# Patient Record
Sex: Female | Born: 1990 | Race: Black or African American | Hispanic: No | Marital: Single | Smoking: Current every day smoker
Health system: Southern US, Community
[De-identification: ages and names within clinical notes are randomized; demographics above are authoritative.]

---
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
## 2009-09-11 ENCOUNTER — Emergency Department (HOSPITAL_COMMUNITY): Admission: EM | Admit: 2009-09-11 | Discharge: 2009-09-11 | Payer: Self-pay | Admitting: Family Medicine

## 2011-04-03 LAB — POCT PREGNANCY, URINE: Preg Test, Ur: NEGATIVE

## 2011-10-02 LAB — POCT RAPID STREP A: Streptococcus, Group A Screen (Direct): NEGATIVE

## 2012-05-23 ENCOUNTER — Emergency Department (HOSPITAL_COMMUNITY)
Admission: EM | Admit: 2012-05-23 | Discharge: 2012-05-23 | Discharge status: Absent without leave | Payer: Self-pay | Source: Home / Self Care

## 2012-09-28 ENCOUNTER — Other Ambulatory Visit (HOSPITAL_COMMUNITY)
Admission: RE | Admit: 2012-09-28 | Discharge: 2012-09-28 | Disposition: A | Discharge status: Home / Self Care | Payer: Self-pay | Source: Ambulatory Visit | Attending: Family Medicine | Admitting: Family Medicine

## 2012-09-28 ENCOUNTER — Other Ambulatory Visit: Payer: Self-pay | Admitting: Family Medicine

## 2012-09-28 DIAGNOSIS — Z124 Encounter for screening for malignant neoplasm of cervix: Secondary | ICD-10-CM | POA: Insufficient documentation

## 2013-01-04 ENCOUNTER — Encounter (HOSPITAL_COMMUNITY): Payer: Self-pay | Admitting: Emergency Medicine

## 2013-01-04 ENCOUNTER — Emergency Department (HOSPITAL_COMMUNITY)
Admission: EM | Admit: 2013-01-04 | Discharge: 2013-01-04 | Disposition: A | Discharge status: Home / Self Care | Payer: BC Managed Care – PPO | Attending: Emergency Medicine | Admitting: Emergency Medicine

## 2013-01-04 DIAGNOSIS — F17200 Nicotine dependence, unspecified, uncomplicated: Secondary | ICD-10-CM | POA: Insufficient documentation

## 2013-01-04 DIAGNOSIS — R0789 Other chest pain: Secondary | ICD-10-CM | POA: Insufficient documentation

## 2013-01-04 LAB — POCT I-STAT, CHEM 8
BUN: 12 mg/dL (ref 6–23)
Calcium, Ion: 1.21 mmol/L (ref 1.12–1.23)
Chloride: 104 mEq/L (ref 96–112)
Creatinine, Ser: 0.9 mg/dL (ref 0.50–1.10)
HCT: 44 % (ref 36.0–46.0)
Hemoglobin: 15 g/dL (ref 12.0–15.0)
Potassium: 4 mEq/L (ref 3.5–5.1)
Sodium: 139 mEq/L (ref 135–145)
TCO2: 26 mmol/L (ref 0–100)

## 2013-01-04 LAB — D-DIMER, QUANTITATIVE: D-Dimer, Quant: <0.27 ug/mL-FEU (ref 0.00–0.48)

## 2013-01-04 MED ORDER — TRAMADOL HCL 50 MG PO TABS: 50.0000 mg | ORAL_TABLET | Freq: Four times a day (QID) | ORAL | Status: AC | PRN

## 2013-01-04 MED ORDER — METHOCARBAMOL 500 MG PO TABS: 500.0000 mg | ORAL_TABLET | Freq: Three times a day (TID) | ORAL | Status: AC

## 2013-01-04 NOTE — ED Notes (Signed)
MD at bedside. 

## 2013-01-04 NOTE — ED Notes (Signed)
Pt alert, arrives from work, c/o left chest wall pain, onset several days ago, describes as dull, radiates to back, seen in Urgent care with same c/o, resp even unlabored, skin pwd

## 2013-01-04 NOTE — ED Provider Notes (Signed)
History     CSN: [id number]  Arrival date & time 01/04/13  0011   First MD Initiated Contact with Patient 01/04/13 0024      Chief Complaint  Patient presents with  . Chest Pain   HPI  History provided by the patient. Patient is a 22 year old female with no significant PMH who presents with complaints of continued left shoulder and chest pains. Patient states she's been having pains off and on for several weeks. Pain has seemed to worsen especially over the past few days. She has been seen and evaluated multiple times in the past and reports being seen in urgent care Center with x-rays performed. She denies having any injury or trauma to the area but was told it may be due to heavy lifting at work. 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 Symptoms are sometimes aggravated by movements and position but not always. She denies any pleuritic pains or shortness of breath. Denies any heart palpitations. Denies any neck pain and weakness in the upper extremities. No recent URI symptoms.    History reviewed. No pertinent past medical history.  History reviewed. No pertinent past surgical history.  No family history on file.  History  Substance Use Topics  . Smoking status: Current Every Day Smoker  . Smokeless tobacco: Not on file  . Alcohol Use: No    OB History    Grav Para Term Preterm Abortions TAB SAB Ect Mult Living                  Review of Systems  Constitutional: Negative for fever and chills.  Respiratory: Negative for cough and shortness of breath.   Cardiovascular: Positive for chest pain. Negative for palpitations and leg swelling.  Gastrointestinal: Negative for nausea and vomiting.  All other systems reviewed and are negative.    Allergies  Review of patient's allergies indicates no known allergies.  Home Medications  No current outpatient  prescriptions on file.  BP 133/80  Pulse 101  Temp 98 F (36.7 C)  Resp 16  Wt 140 lb (63.504 kg)  SpO2 99%  LMP 12/23/2012  Physical Exam  Nursing note and vitals reviewed. Constitutional: She is oriented to person, place, and time. She appears well-developed and well-nourished. No distress.  HENT:  Head: Normocephalic.  Cardiovascular: Normal rate and regular rhythm.   No murmur heard. Pulmonary/Chest: Effort normal and breath sounds normal. No respiratory distress. She has no rales. She exhibits no tenderness.  Abdominal: Soft. There is no tenderness. There is no rebound and no guarding.  Musculoskeletal: Normal range of motion. She exhibits no edema and no tenderness.       No clinical signs concerning for DVT.  Neurological: She is alert and oriented to person, place, and time.  Skin: Skin is warm and dry. No rash noted.  Psychiatric: She has a normal mood and affect. Her behavior is normal.    ED Course  Procedures  Results for orders placed during the hospital encounter of 01/04/13  POCT I-STAT, CHEM 8      Component Value Range   Sodium 139  135 - 145 mEq/L   Potassium 4.0  3.5 - 5.1 mEq/L   Chloride 104  96 - 112 mEq/L   BUN 12  6 - 23 mg/dL   Creatinine, Ser 4.09  0.50 - 1.10 mg/dL   Glucose, Bld  103 (*) 70 - 99 mg/dL   Calcium, Ion 8.11  9.14 - 1.23 mmol/L   TCO2 26  0 - 100 mmol/L   Hemoglobin 15.0  12.0 - 15.0 g/dL   HCT 78.2  95.6 - 21.3 %  D-DIMER, QUANTITATIVE      Component Value Range   D-Dimer, Quant <0.27  0.00 - 0.48 ug/mL-FEU     1. Musculoskeletal chest pain       MDM  12:25AM patient seen and evaluated. Patient well-appearing in no acute distress. Normal heart rate at rest. Patient with no recent long travel. No hemoptysis. No pleuritic chest pain. No prior history of DVT or PE. No clinical signs concerning for DVT. No pertinent for estrogen use. Doubt PE.  Patient with negative d-dimer. Unremarkable labs. Chest pain consistent with  muscle skeletal pain. Will have patient continue to followup with PCP.        Phill [employer], [place] 01/04/13 [phone]

## 2013-01-04 NOTE — ED Provider Notes (Signed)
Medical screening examination/treatment/procedure(s) were performed by non-physician practitioner and as supervising physician I was immediately available for consultation/collaboration.  Derwood Kaplan, MD 01/04/13 2306

## 2014-09-20 ENCOUNTER — Ambulatory Visit
Admission: RE | Admit: 2014-09-20 | Discharge: 2014-09-20 | Disposition: A | Discharge status: Home / Self Care | Payer: BC Managed Care – PPO | Source: Ambulatory Visit | Attending: Family Medicine | Admitting: Family Medicine

## 2014-09-20 ENCOUNTER — Other Ambulatory Visit: Payer: Self-pay | Admitting: Family Medicine

## 2014-09-20 DIAGNOSIS — M25512 Pain in left shoulder: Secondary | ICD-10-CM

## 2014-09-21 ENCOUNTER — Other Ambulatory Visit: Payer: Self-pay | Admitting: Family Medicine

## 2014-09-21 DIAGNOSIS — R591 Generalized enlarged lymph nodes: Secondary | ICD-10-CM

## 2014-09-28 ENCOUNTER — Ambulatory Visit
Admission: RE | Admit: 2014-09-28 | Discharge: 2014-09-28 | Disposition: A | Discharge status: Home / Self Care | Payer: BC Managed Care – PPO | Source: Ambulatory Visit | Attending: Family Medicine | Admitting: Family Medicine

## 2014-09-28 DIAGNOSIS — R591 Generalized enlarged lymph nodes: Secondary | ICD-10-CM

## 2015-01-16 ENCOUNTER — Emergency Department (HOSPITAL_COMMUNITY)
Admission: EM | Admit: 2015-01-16 | Discharge: 2015-01-17 | Disposition: A | Discharge status: Home / Self Care | Payer: BLUE CROSS/BLUE SHIELD | Attending: Emergency Medicine | Admitting: Emergency Medicine

## 2015-01-16 ENCOUNTER — Encounter (HOSPITAL_COMMUNITY): Payer: Self-pay

## 2015-01-16 ENCOUNTER — Emergency Department (HOSPITAL_COMMUNITY): Payer: BLUE CROSS/BLUE SHIELD

## 2015-01-16 DIAGNOSIS — Z79899 Other long term (current) drug therapy: Secondary | ICD-10-CM | POA: Diagnosis not present

## 2015-01-16 DIAGNOSIS — R0789 Other chest pain: Secondary | ICD-10-CM

## 2015-01-16 DIAGNOSIS — Z720 Tobacco use: Secondary | ICD-10-CM | POA: Diagnosis not present

## 2015-01-16 DIAGNOSIS — R112 Nausea with vomiting, unspecified: Secondary | ICD-10-CM | POA: Diagnosis not present

## 2015-01-16 DIAGNOSIS — R1110 Vomiting, unspecified: Secondary | ICD-10-CM | POA: Diagnosis present

## 2015-01-16 DIAGNOSIS — Z3202 Encounter for pregnancy test, result negative: Secondary | ICD-10-CM | POA: Diagnosis not present

## 2015-01-16 LAB — COMPREHENSIVE METABOLIC PANEL
ALT: 17 U/L (ref 0–35)
AST: 33 U/L (ref 0–37)
Albumin: 5.1 g/dL (ref 3.5–5.2)
Alkaline Phosphatase: 62 U/L (ref 39–117)
Anion gap: 8 (ref 5–15)
BUN: 12 mg/dL (ref 6–23)
CO2: 25 mmol/L (ref 19–32)
Calcium: 9.6 mg/dL (ref 8.4–10.5)
Chloride: 107 mEq/L (ref 96–112)
Creatinine, Ser: 0.82 mg/dL (ref 0.50–1.10)
GFR calc Af Amer: >90 mL/min (ref >90)
GFR calc non Af Amer: >90 mL/min (ref >90)
Glucose, Bld: 98 mg/dL (ref 70–99)
Potassium: 3.7 mmol/L (ref 3.5–5.1)
Sodium: 140 mmol/L (ref 135–145)
Total Bilirubin: 1.2 mg/dL (ref 0.3–1.2)
Total Protein: 8.4 g/dL — ABNORMAL HIGH (ref 6.0–8.3)

## 2015-01-16 LAB — CBC WITH DIFFERENTIAL/PLATELET
Basophils Absolute: 0 10*3/uL (ref 0.0–0.1)
Basophils Relative: 0 % (ref 0–1)
Eosinophils Absolute: 0 10*3/uL (ref 0.0–0.7)
Eosinophils Relative: 1 % (ref 0–5)
HCT: 42 % (ref 36.0–46.0)
Hemoglobin: 13.6 g/dL (ref 12.0–15.0)
Lymphocytes Relative: 11 % — ABNORMAL LOW (ref 12–46)
Lymphs Abs: 0.6 10*3/uL — ABNORMAL LOW (ref 0.7–4.0)
MCH: 26.4 pg (ref 26.0–34.0)
MCHC: 32.4 g/dL (ref 30.0–36.0)
MCV: 81.6 fL (ref 78.0–100.0)
Monocytes Absolute: 0.4 10*3/uL (ref 0.1–1.0)
Monocytes Relative: 7 % (ref 3–12)
Neutro Abs: 5 10*3/uL (ref 1.7–7.7)
Neutrophils Relative %: 81 % — ABNORMAL HIGH (ref 43–77)
Platelets: 323 10*3/uL (ref 150–400)
RBC: 5.15 MIL/uL — ABNORMAL HIGH (ref 3.87–5.11)
RDW: 13.6 % (ref 11.5–15.5)
WBC: 6.1 10*3/uL (ref 4.0–10.5)

## 2015-01-16 LAB — URINALYSIS, ROUTINE W REFLEX MICROSCOPIC
Bilirubin Urine: NEGATIVE
Glucose, UA: NEGATIVE mg/dL
Hgb urine dipstick: NEGATIVE
Ketones, ur: 15 mg/dL — AB
Leukocytes, UA: NEGATIVE
Nitrite: NEGATIVE
Protein, ur: NEGATIVE mg/dL
Specific Gravity, Urine: 1.027 (ref 1.005–1.030)
Urobilinogen, UA: 0.2 mg/dL (ref 0.0–1.0)
pH: 7.5 (ref 5.0–8.0)

## 2015-01-16 LAB — PREGNANCY, URINE: Preg Test, Ur: NEGATIVE

## 2015-01-16 MED ORDER — SODIUM CHLORIDE 0.9 % IV BOLUS (SEPSIS)
1000.0000 mL | Freq: Once | INTRAVENOUS | Status: AC
  Administered 2015-01-16: 1000 mL via INTRAVENOUS

## 2015-01-16 MED ORDER — ONDANSETRON HCL 4 MG/2ML IJ SOLN
4.0000 mg | Freq: Once | INTRAMUSCULAR | Status: AC
  Administered 2015-01-16: 4 mg via INTRAVENOUS
  Filled 2015-01-16: qty 2

## 2015-01-16 NOTE — ED Provider Notes (Signed)
CSN: [id number]     Arrival date & time 01/16/15  2129 History   First MD Initiated Contact with Patient 01/16/15 2145     Chief Complaint  Patient presents with  . Emesis     (Consider location/radiation/quality/duration/timing/severity/associated sxs/prior Treatment) HPI Patient presents to the emergency department with 2 episodes of vomiting and central discomfort during those episodes in her chest.  The patient states she also has this spasm-like sensation under her rib cage and breast on the left.  This has been occurring over the last 2 weeks.  The patient states she saw her primary doctor who thought she was having muscle spasms.  The patient states that she has had no abdominal pain, weakness, dizziness, headache, blurred vision, back pain, neck pain, fever, cough, rash, near syncope, lightheadedness or syncope.  Patient states nothing seems make her condition better or worse.  The patient currently is not having any pain. History reviewed. No pertinent past medical history. History reviewed. No pertinent past surgical history. History reviewed. No pertinent family history. History  Substance Use Topics  . Smoking status: Current Every Day Smoker  . Smokeless tobacco: Not on file  . Alcohol Use: No   OB History    No data available     Review of Systems  All other systems negative except as documented in the HPI. All pertinent positives and negatives as reviewed in the HPI.  Allergies  Sulfa antibiotics  Home Medications   Prior to Admission medications   Medication Sig Start Date End Date Taking? Authorizing Provider  LYSINE PO Take 1 tablet by mouth daily.    Historical Provider, MD  methocarbamol (ROBAXIN) 500 MG tablet Take 1 tablet (500 mg total) by mouth 3 (three) times daily. 01/04/13   Angus SellerPeter S Dammen, PA-C  Multiple Vitamin (MULTIVITAMIN WITH MINERALS) TABS Take 1 tablet by mouth daily.    Historical Provider, MD  traMADol (ULTRAM) 50 MG tablet Take 1 tablet (50  mg total) by mouth every 6 (six) hours as needed for pain. 01/04/13   Phill MutterPeter S Dammen, PA-C   BP 123/88 mmHg  Pulse 95  Temp(Src) 98.6 F (37 C) (Oral)  Resp 20  Ht 5' (1.524 m)  Wt 148 lb (67.132 kg)  BMI 28.90 kg/m2  SpO2 100%  LMP 01/10/2015 Physical Exam  Constitutional: She is oriented to person, place, and time. She appears well-developed and well-nourished. No distress.  HENT:  Head: Normocephalic and atraumatic.  Mouth/Throat: Oropharynx is clear and moist.  Eyes: Pupils are equal, round, and reactive to light.  Neck: Normal range of motion. Neck supple.  Cardiovascular: Normal rate, regular rhythm and intact distal pulses.  Exam reveals no gallop and no friction rub.   No murmur heard. Pulmonary/Chest: Effort normal and breath sounds normal. No respiratory distress. She exhibits no tenderness.  Abdominal: Soft. Bowel sounds are normal. She exhibits no distension. There is no tenderness. There is no rebound and no guarding.  Musculoskeletal: She exhibits no edema.  Neurological: She is alert and oriented to person, place, and time. She exhibits normal muscle tone. Coordination normal.  Skin: Skin is warm and dry. No rash noted. No erythema.  Psychiatric: She has a normal mood and affect. Her behavior is normal.  Nursing note and vitals reviewed.   ED Course  Procedures (including critical care time) Labs Review Labs Reviewed  COMPREHENSIVE METABOLIC PANEL - Abnormal; Notable for the following:    Total Protein 8.4 (*)    All other components within  normal limits  CBC WITH DIFFERENTIAL - Abnormal; Notable for the following:    RBC 5.15 (*)    Neutrophils Relative % 81 (*)    Lymphocytes Relative 11 (*)    Lymphs Abs 0.6 (*)    All other components within normal limits  URINALYSIS, ROUTINE W REFLEX MICROSCOPIC - Abnormal; Notable for the following:    Ketones, ur 15 (*)    All other components within normal limits  PREGNANCY, URINE    Imaging Review Dg Chest 2  View  01/16/2015   CLINICAL DATA:  Vomiting for 24 hr, LEFT anterior chest pain, shortness of breath.  EXAM: CHEST  2 VIEW  COMPARISON:  Chest radiograph October 08, 2012  FINDINGS: Cardiomediastinal silhouette is unremarkable. The lungs are clear without pleural effusions or focal consolidations. Trachea projects midline and there is no pneumothorax. Soft tissue planes and included osseous structures are non-suspicious.  IMPRESSION: Normal chest.   Electronically Signed   By: Awilda Metro   On: 01/16/2015 23:14   Patient will be referred to her primary care doctor.  Her testing here tonight did not show any abnormality other than mild dehydration.  The patient is advised to slowly increase her fluid intake, rest as much as possible.  Patient is low risk based on well's criteria and is PERC negative.  The patient is advised she will need further workup on this spasm-like sensation in her lower left chest region  MDM   Final diagnoses:  None      Carlyle Dolly, PA-C 01/17/15 1610  Gwyneth Sprout, MD 01/17/15 2249

## 2015-01-16 NOTE — ED Notes (Signed)
PA cancelled the d dimer

## 2015-01-16 NOTE — ED Notes (Signed)
Patient is out of the room I will collect D dimer when  Patient return from xray

## 2015-01-16 NOTE — ED Notes (Signed)
Pt complains of vomiting twice today and being short of breath, she complains of centralized chest pain when vomiting

## 2015-01-17 MED ORDER — IBUPROFEN 800 MG PO TABS: 800.0000 mg | ORAL_TABLET | Freq: Three times a day (TID) | ORAL | Status: AC | PRN

## 2015-01-17 MED ORDER — PROMETHAZINE HCL 25 MG PO TABS: 25.0000 mg | ORAL_TABLET | Freq: Three times a day (TID) | ORAL | Status: AC | PRN

## 2015-01-17 NOTE — ED Notes (Signed)
Pt reports SOB, vomiting x 2 and mid-sternal cp today.  Pt in no acute distress. A&Ox 4.

## 2015-01-17 NOTE — Discharge Instructions (Signed)
REturn here as needed. Follow up with your doctor for a recheck or the clinic provided.

## 2016-01-01 IMAGING — CR DG CHEST 2V
2 series · 2 of 2 positions shown · non-contrast
Comparison: Chest radiograph October 08, 2012

CLINICAL DATA: Vomiting for 24 hr, LEFT anterior chest pain,
shortness of breath.

EXAM:
CHEST  2 VIEW

[w chest pa]
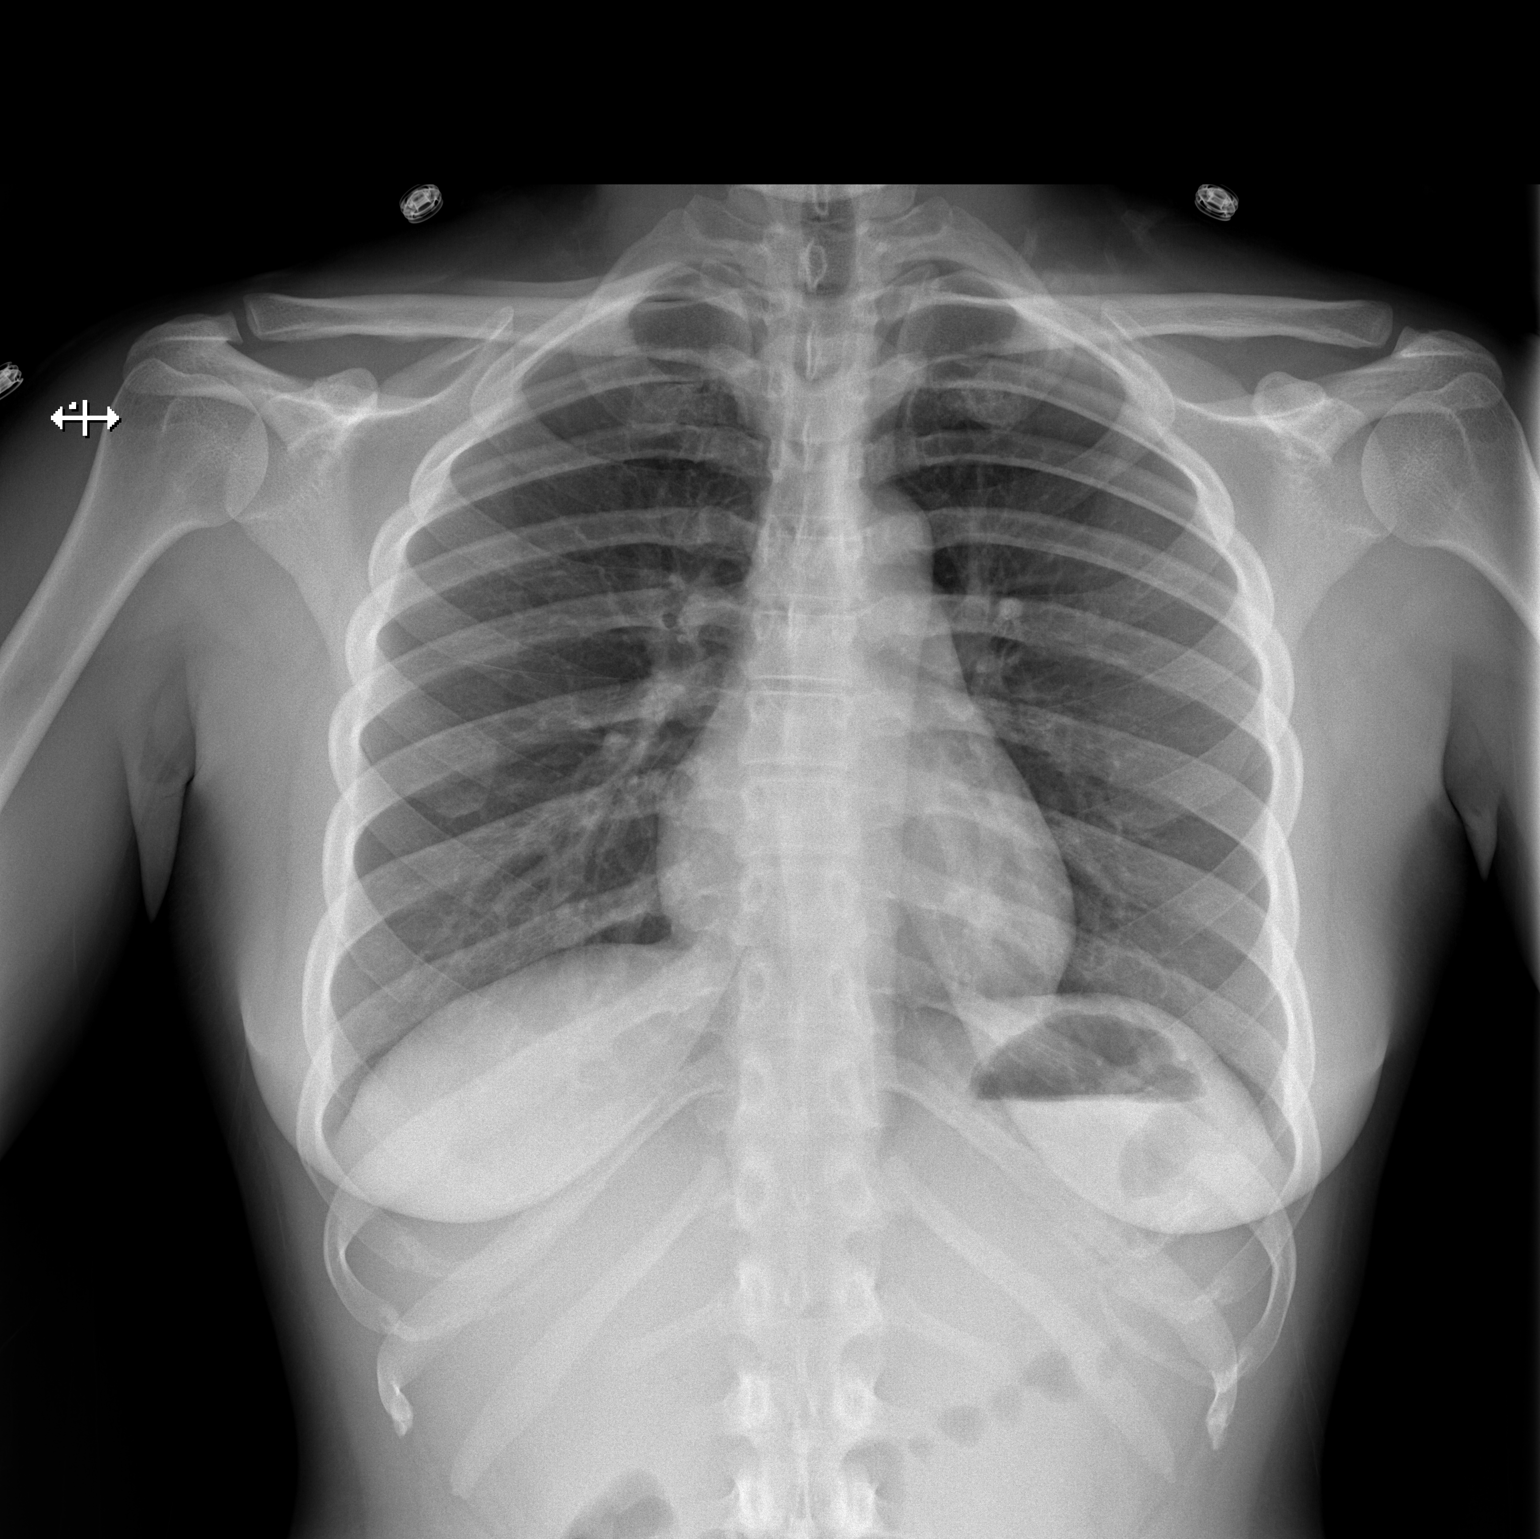

[w chest lat]
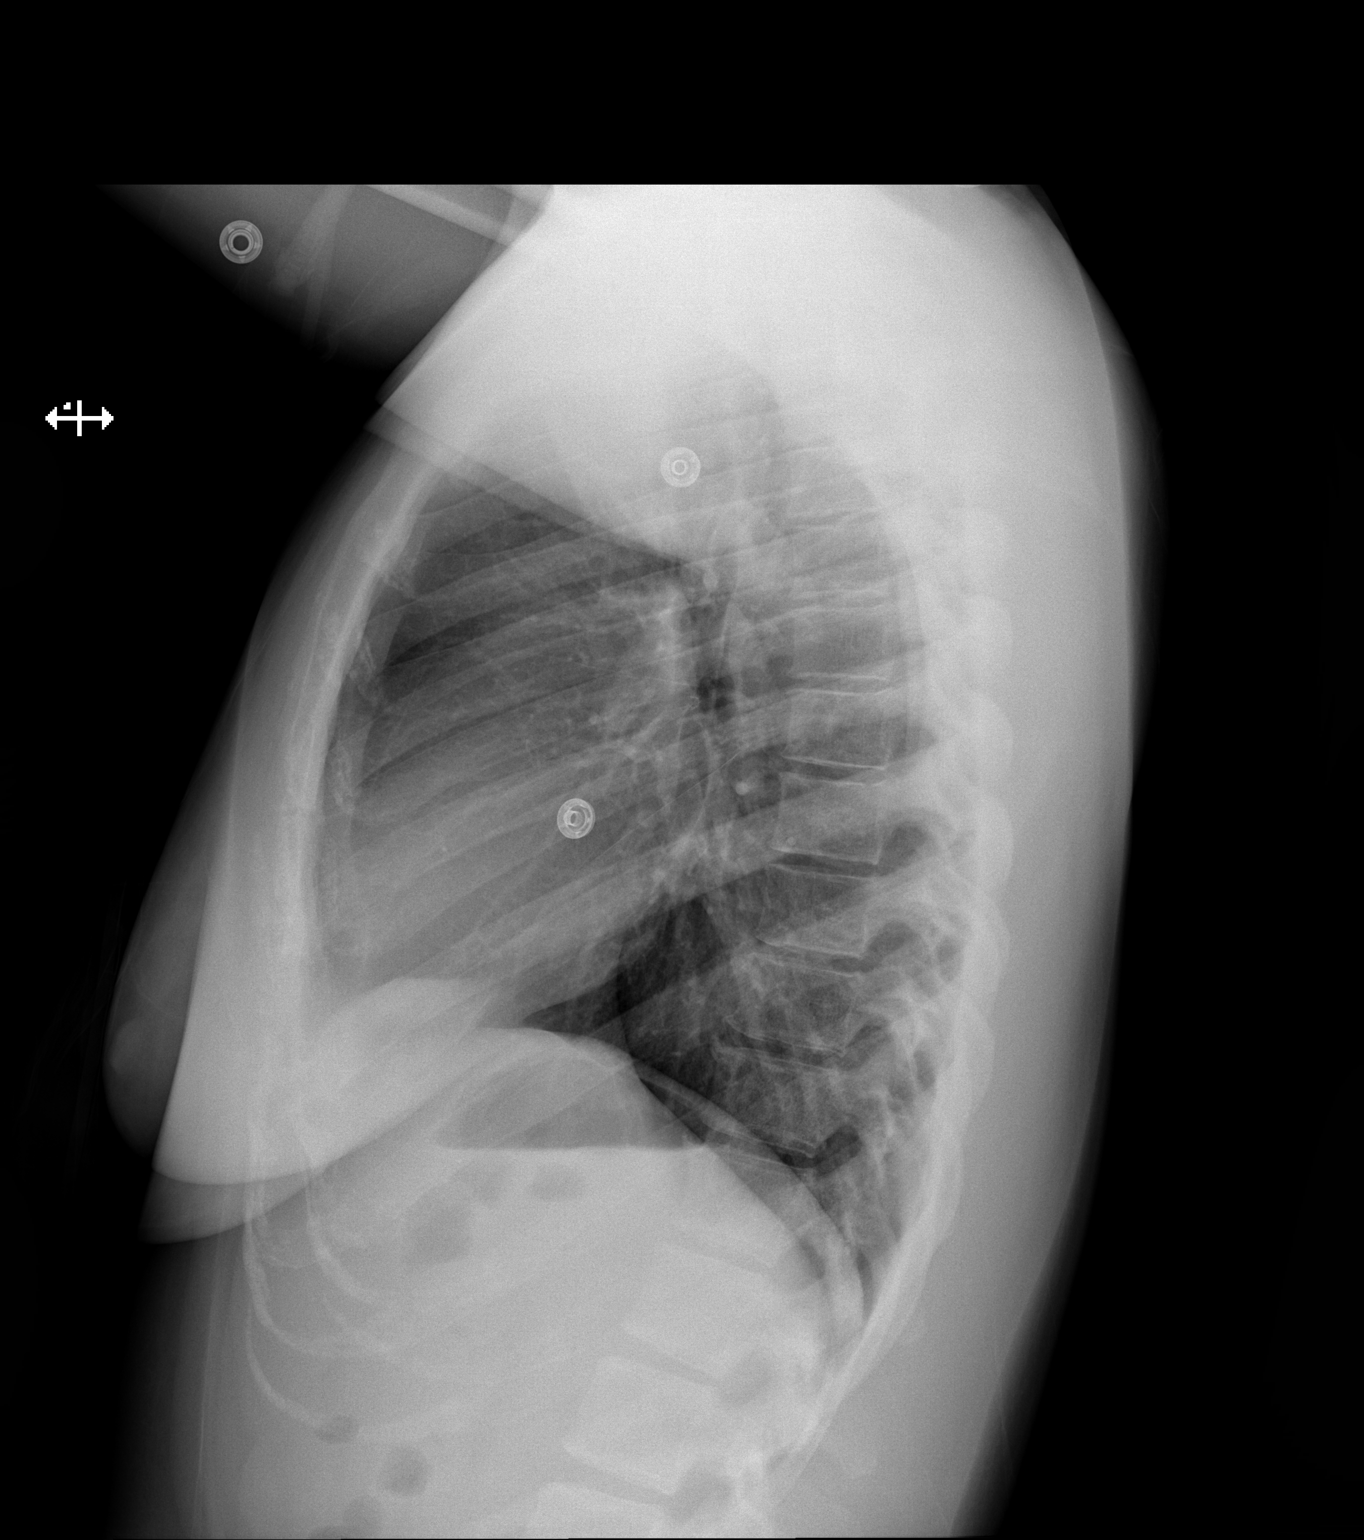

[2 of 2 positions shown; findings below may reference images not displayed]

FINDINGS: Cardiomediastinal silhouette is unremarkable. The lungs are clear
without pleural effusions or focal consolidations. Trachea projects
midline and there is no pneumothorax. Soft tissue planes and
included osseous structures are non-suspicious.
IMPRESSION: Normal chest.

  By: Sergio Lopes Landaverri

## 2016-02-07 ENCOUNTER — Other Ambulatory Visit (HOSPITAL_COMMUNITY)
Admission: RE | Admit: 2016-02-07 | Discharge: 2016-02-07 | Disposition: A | Discharge status: Home / Self Care | Payer: BLUE CROSS/BLUE SHIELD | Source: Ambulatory Visit | Attending: Family Medicine | Admitting: Family Medicine

## 2016-02-07 ENCOUNTER — Other Ambulatory Visit: Payer: Self-pay | Admitting: Family Medicine

## 2016-02-07 DIAGNOSIS — Z01411 Encounter for gynecological examination (general) (routine) with abnormal findings: Secondary | ICD-10-CM | POA: Insufficient documentation

## 2016-02-11 LAB — CYTOLOGY - PAP

## 2018-01-14 DIAGNOSIS — Z0000 Encounter for general adult medical examination without abnormal findings: Secondary | ICD-10-CM | POA: Diagnosis not present

## 2018-01-14 DIAGNOSIS — Z23 Encounter for immunization: Secondary | ICD-10-CM | POA: Diagnosis not present

## 2018-05-06 DIAGNOSIS — S31815A Open bite of right buttock, initial encounter: Secondary | ICD-10-CM | POA: Diagnosis not present

## 2018-05-06 DIAGNOSIS — T148XXA Other injury of unspecified body region, initial encounter: Secondary | ICD-10-CM | POA: Diagnosis not present

## 2018-07-07 DIAGNOSIS — N762 Acute vulvitis: Secondary | ICD-10-CM | POA: Diagnosis not present

## 2018-07-07 DIAGNOSIS — B373 Candidiasis of vulva and vagina: Secondary | ICD-10-CM | POA: Diagnosis not present

## 2019-01-25 ENCOUNTER — Other Ambulatory Visit (HOSPITAL_COMMUNITY)
Admission: RE | Admit: 2019-01-25 | Discharge: 2019-01-25 | Disposition: A | Discharge status: Home / Self Care | Payer: 59 | Source: Ambulatory Visit | Attending: Family Medicine | Admitting: Family Medicine

## 2019-01-25 ENCOUNTER — Other Ambulatory Visit: Payer: Self-pay | Admitting: Family Medicine

## 2019-01-25 DIAGNOSIS — Z01411 Encounter for gynecological examination (general) (routine) with abnormal findings: Secondary | ICD-10-CM | POA: Insufficient documentation

## 2019-01-25 DIAGNOSIS — Z0000 Encounter for general adult medical examination without abnormal findings: Secondary | ICD-10-CM | POA: Diagnosis not present

## 2019-01-30 LAB — CYTOLOGY - PAP
Adequacy: ABSENT
Diagnosis: NEGATIVE
HPV: NOT DETECTED

## 2020-06-03 DIAGNOSIS — Z0000 Encounter for general adult medical examination without abnormal findings: Secondary | ICD-10-CM | POA: Diagnosis not present

## 2020-06-03 DIAGNOSIS — Z13220 Encounter for screening for lipoid disorders: Secondary | ICD-10-CM | POA: Diagnosis not present

## 2021-06-03 DIAGNOSIS — Z0000 Encounter for general adult medical examination without abnormal findings: Secondary | ICD-10-CM | POA: Diagnosis not present

## 2023-06-23 ENCOUNTER — Other Ambulatory Visit: Payer: Self-pay

## 2023-06-23 ENCOUNTER — Encounter (HOSPITAL_COMMUNITY): Payer: Self-pay

## 2023-06-23 ENCOUNTER — Ambulatory Visit (HOSPITAL_COMMUNITY)
Admission: EM | Admit: 2023-06-23 | Discharge: 2023-06-23 | Disposition: A | Discharge status: Home / Self Care | Payer: No Typology Code available for payment source | Attending: Registered Nurse | Admitting: Registered Nurse

## 2023-06-23 ENCOUNTER — Emergency Department (HOSPITAL_COMMUNITY)
Admission: EM | Admit: 2023-06-23 | Discharge: 2023-06-23 | Disposition: A | Discharge status: Home / Self Care | Payer: No Typology Code available for payment source | Attending: Emergency Medicine | Admitting: Emergency Medicine

## 2023-06-23 ENCOUNTER — Encounter (HOSPITAL_COMMUNITY): Payer: Self-pay | Admitting: Registered Nurse

## 2023-06-23 DIAGNOSIS — F19159 Other psychoactive substance abuse with psychoactive substance-induced psychotic disorder, unspecified: Secondary | ICD-10-CM | POA: Diagnosis not present

## 2023-06-23 DIAGNOSIS — F22 Delusional disorders: Secondary | ICD-10-CM | POA: Diagnosis not present

## 2023-06-23 DIAGNOSIS — F1910 Other psychoactive substance abuse, uncomplicated: Secondary | ICD-10-CM

## 2023-06-23 DIAGNOSIS — R4689 Other symptoms and signs involving appearance and behavior: Secondary | ICD-10-CM | POA: Diagnosis present

## 2023-06-23 DIAGNOSIS — F19959 Other psychoactive substance use, unspecified with psychoactive substance-induced psychotic disorder, unspecified: Secondary | ICD-10-CM | POA: Diagnosis present

## 2023-06-23 NOTE — Discharge Instructions (Signed)
Go to the behavioral health urgent care.  They should help with a quicker evaluation.

## 2023-06-23 NOTE — Progress Notes (Addendum)
   06/23/23 1132  [facility] Triage Screening (Walk-ins at [facility] only)  What Is the Reason for Your Visit/Call Today? 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 Pt appears to be paranoid stating that there is something going on with her phone. Pt states someone is deleting information from her phone and "they" have access to her phone. She states "I don't feel safe being here, they are trying to mess with my mental health". Pts mother reports the pts behavior started during the pandemic and worsened in January. Pts mother reports the pt uses marijuana on occasion. Pts mother states they are interested in outpatient assistance. Per pts mother the pt preferred to see a African American provider due to her paranoia towards Caucasian people.Pt denies SI.HI and AVH.  How Long Has This Been Causing You Problems? > than 6 months  Have You Recently Had Any Thoughts About Hurting Yourself? No  Are You Planning to Commit Suicide/Harm Yourself At This time? No  Have you Recently Had Thoughts About Hurting Someone Karolee Ohs? No  Are You Planning To Harm Someone At This Time? No  Are you currently experiencing any auditory, visual or other hallucinations? No  Have You Used Any Alcohol or Drugs in the Past 24 Hours? No  Do you have any current medical co-morbidities that require immediate attention? No  Clinician description of patient physical appearance/behavior: paranoid,well groomed, cooperative  What Do You Feel Would Help You the Most Today? Treatment for Depression or other mood problem  If access to [facility] was not available, would you have sought care in the Emergency Department? No  Determination of Need Routine (7 days)  Options For Referral Outpatient Therapy;Medication Management

## 2023-06-23 NOTE — ED Triage Notes (Signed)
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  Mother is shaking her head. Reports that is what is affecting her minds.  Denies SI but reluctant to say she denies SI. Denies previous mental health issues.

## 2023-06-23 NOTE — Discharge Instructions (Addendum)
     Same-day appointments Operating hours and clinician availability may delay appointments until the next business day. [facility] and Current Patients  Psychiatry hours Monday-Friday, 8am-4:30pm (Eastern Time) If you are experiencing problems accessing [facility] send email to: [email or web address] or call [phone], Monday-Friday, 8:00am-4:30pm If you are having a psychiatric or medical emergency, please call 911 or go to the nearest emergency department. To reach the Suicide and Crisis Lifeline, please call or text [phone].  What is [facility]?  [facility] is an online service that provides same-day access to psychiatry to meet urgent mental health needs. The goal is to provide patients with timely intervention and keep them in an outpatient setting.  How long will I wait to see a clinician?  Our goal is to provide same-day care. However, operating hours and clinician availability may delay appointments until the next business day.  What if I can't get an appointment?  Please call [facility] at [phone] 8am to 5pm [place] Time or email [place]:  [email or web address]  to request the next available time. If you are having a psychiatric or medical emergency, please call 911 or go to the nearest emergency department. To reach the Suicide and Crisis Lifeline, please call or text [phone]. Do you accept insurance?  Yes. We accept most commercial insurance plans. What information do you need from me? New patients should be ready to provide:  Photo ID  Insurance card  Payment information  For current patients, our specialists will confirm your documentation is on file.   Can I continue with regular care after my [facility] session?  Yes. Our goal is to make sure you have the follow-up care you need. Our specialist can help you schedule an appointment with an ongoing provider in addition to your [facility] Dem[facility] session.  Can my current clinician  provide treatment on [facility]?  No. Our [facility] clinicians are trained to assist with more immediate needs and provide support between regular appointments with your clinician.  What device can I use to connect with [facility]?  You can use any Wi-Fi-enabled device with a camera and a microphone, such as a smartphone, tablet, or computer.  Do I have to be at home to connect with [facility]?  No. As long as you are located within [place] or [place] you can connect with a provider. We do request that you connect from a safe and private location. Your clinician is required to document your location for emergency purposes.  Delta-8  and Delta-9 (CBD)  Tetrahydrocannabinol (THC) has psychoactive and intoxicating effect. THC is a cannabinoid, a category of chemicals that interact with the body's endocannabinoid system. By attaching to cannabinoid receptors in the brain, THC activates neurons that influence pleasure, memory, thinking, coordination, and time perception.  Delta-8/9 THC products have not been evaluated or approved by the FDA for safe use and may be marketed in ways that put the public health at risk.  Some concerns include variability in product formulations and product labeling, other cannabinoid and terpene content, and variable Delta-8/9 THC concentrations.    Side effects or adverse reactions to Delta 8 and Delta 9 (Staiva, Indica) CBD Dry mouth. Vomiting. Trouble standing. Loss of consciousness. Memory loss. Hallucinations. Trouble with coordination. Changes in appetite and weight. Plus, there are other risks associated with getting high, drug tests, legality, and potential poisoning.

## 2023-06-23 NOTE — ED Provider Notes (Signed)
[place] EMERGENCY DEPARTMENT AT [facility] Provider Note   CSN: [id number] Arrival date & time: 06/23/23  1020     History  Chief Complaint  Patient presents with   Psychiatric Evaluation    Donna Craig is a 32 y.o. female.  HPI Patient presents to the ER.  Reportedly is dealing with mental health issues.  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  States she has got her degree and was working at the job.  States they were monitoring her and did not agree with what she was saying.  No suicidal or homicidal thoughts.  No mental health history.  Reportedly has been seeing some things that are not there.  Has ideas of the KKK leaving signs around.  Patient presents with her mother.   History reviewed. No pertinent past medical history.  Home Medications Prior to Admission medications   Medication Sig Start Date End Date Taking? Authorizing Provider  ibuprofen (ADVIL,MOTRIN) 800 MG tablet Take 1 tablet (800 mg total) by mouth every 8 (eight) hours as needed. 01/17/15   Lawyer, Cristal Deer, PA-C  methocarbamol (ROBAXIN) 500 MG tablet Take 1 tablet (500 mg total) by mouth 3 (three) times daily. Patient not taking: Reported on 01/16/2015 01/04/13   Ivonne Andrew, PA-C  promethazine (PHENERGAN) 25 MG tablet Take 1 tablet (25 mg total) by mouth every 8 (eight) hours as needed for nausea or vomiting. 01/17/15   Lawyer, Cristal Deer, PA-C  traMADol (ULTRAM) 50 MG tablet Take 1 tablet (50 mg total) by mouth every 6 (six) hours as needed for pain. Patient not taking: Reported on 01/16/2015 01/04/13   Ivonne Andrew, PA-C      Allergies    Sulfa antibiotics    Review of Systems   Review of Systems  Physical Exam Updated Vital Signs BP 135/87 (BP Location: Right Arm)   Pulse 71   Temp 98.1 F (36.7 C) (Oral)   Resp 17   Ht 5' (1.524 m)   Wt 67.1 kg   SpO2 100%   BMI 28.90 kg/m  Physical Exam Vitals and nursing note reviewed.  Cardiovascular:     Rate and  Rhythm: Normal rate.  Neurological:     Mental Status: She is alert and oriented to person, place, and time.     ED Results / Procedures / Treatments   Labs (all labs ordered are listed, but only abnormal results are displayed) Labs Reviewed - No data to display  EKG None  Radiology No results found.  Procedures Procedures    Medications Ordered in ED Medications - No data to display  ED Course/ Medical Decision Making/ A&P                             Medical Decision Making  Patient presented with anxiety and paranoia.  States due to racism.  No history of psychiatric disorder but apparently has been having paranoia.  States that she is seeing some things around better due to the racism.  Including a [place] on the back of a car.  Patient is refusing blood work at this time.  Occasional marijuana use but otherwise denies blood use.  Does have somewhat rambling speech.  Discussed with patient and her mother.  Does not want the blood work now but is willing to get psychiatric evaluation.  I think the blood would help but evaluation is more important.  Mother will take her to the behavioral health  urgent care who helpfully can help with a more expeditious evaluation.  With the paranoia there is potential causes such as new onset schizophrenia.        Final Clinical Impression(s) / ED Diagnoses Final diagnoses:  Paranoia [facility])    Rx / DC Orders ED Discharge Orders     None         Benjiman Core, MD 06/23/23 1119

## 2023-06-23 NOTE — ED Notes (Signed)
Dc instructions reviewed with pt no questions or concerns at this time. Will follow up with behavioral health.

## 2023-06-23 NOTE — ED Provider Notes (Signed)
[facility] Medical Screening Exam  Patient Name: Donna Craig MRN: [id number] Date of Evaluation: 06/23/23 Chief Complaint:   Diagnosis:  Final diagnoses:  Substance-induced psychotic disorder (HCC)  Synthetic cannabinoid abuse (HCC)    History of Present illness: Donna Craig is a 32 y.o. female patient presented to [facility] as a walk in accompanied by her mother with complaints of needing psychiatric resources for outpatient services   Donna Craig, 32 y.o., female patient seen face to face by this provider, consulted with Dr. Nelly Rout; and chart reviewed on 06/23/23.  On evaluation Donna Craig reports she was sent here [facility]) from the emergency room to get resources for outpatient psychiatric services.  Patient states that they (referring to MD in emergency room) said I was paranoid but I don't agree with that."  Patient states that she is a black gay female an she has been dealing with  is institutional systematic prejudice at work and school.  She states she is tired of it and came to get resources so that "It can be put on record that I came and sought out treatment and I want it on record that I had to seek treatment to deal with this racism.  Patient denies suicidal/self-harm/homicidal ideation, psychosis, and paranoia.  She states that she is not paranoid because her statements are true and that things really are happening.  Patient allows he mother to sit in during assessment and to give collateral information. Mother states "This is not my child.  This is not how she acts or speaks.  She states that patient is paranoid and that patient has called her from work telling her "Somebody her got your birthday or know where you stay.  I try to tell her that anyone can have the birth date of [birth year] or [birth year] but she doesn't listen.  Patient states that she uses syntenic cannabis products (Delta 8, 9, 10, and CBD) at least 3 times or more a week.  Discussed some of the  adverse reactions to those products such as paranoia and hallucinations.  Patients mother also feels that patient behavior is related to products.  States that since patient has started using unable to keep a job and having to move back home.     During evaluation Donna Craig is sitting in chair, dressed appropriate for weather with no noted distress.  She is alert/oriented x 4, calm, cooperative, attentive, and responses were relevant and appropriate to assessment questions.  She spoke in a clear tone at moderate volume, and normal pace, with good eye contact.   She denies suicidal/self-harm/homicidal ideation, psychosis, and paranoia.  Objectively:  There is evidence of paranoia and some delusional thinking when it comes to her race and sexuality.  But she is I able to converse coherently.  Her mother doesn't feel that she is a danger to herself or others.  There is no criteria that would recommend involuntary commitment for inpatient psychiatric treatment and patient is only wanting outpatient psychiatric services at this time.  Encouraged to stop syntenic cannabis.  Resources given.      At this time Donna Craig is educated and verbalizes understanding of mental health resources and other crisis services in the community. She is instructed to call 911 and present to the nearest emergency room should she experience any suicidal/homicidal ideation, auditory/visual/hallucinations, or detrimental worsening of her mental health condition.  She was a also advised by Clinical research associate that she could call the toll-free phone on back  of  insurance card to assist with identifying counselors and agencies in [employer] [facility] ED from 06/23/2023 in [facility] Most recent reading at 06/23/2023 11:49 AM ED from 06/23/2023 in [facility] Emergency Department at [facility] Most recent reading at 06/23/2023 10:58 AM  C-SSRS RISK CATEGORY No Risk No Risk       Psychiatric Specialty  Exam  Presentation  General Appearance:Appropriate for Environment  Eye Contact:Good  Speech:Clear and Coherent; Normal Rate  Speech Volume:Normal  Handedness:Right   Mood and Affect  Mood: Anxious  Affect: Congruent   Thought Process  Thought Processes: Goal Directed; Coherent  Descriptions of Associations:Circumstantial  Orientation:Full (Time, Place and Person)  Thought Content:Logical; Paranoid Ideation; Rumination  Diagnosis of Schizophrenia or Schizoaffective disorder in past: No   Hallucinations:None  Ideas of Reference:None  Suicidal Thoughts:No  Homicidal Thoughts:No   Sensorium  Memory: Immediate Good; Recent Good  Judgment: Fair  Insight: Present   Executive Functions  Concentration: Fair  Attention Span: Good  Recall: Good  Fund of Knowledge: Good  Language: Good   Psychomotor Activity  Psychomotor Activity: Normal   Assets  Assets: Communication Skills; Desire for Improvement; Financial Resources/Insurance; Housing; Leisure Time; Physical Health; Resilience; Social Support   Sleep  Sleep: Fair  Number of hours: No data recorded  Physical Exam: Physical Exam Vitals and nursing note reviewed. Chaperone present: Mother present.  Constitutional:      General: She is not in acute distress.    Appearance: Normal appearance. She is not ill-appearing.  HENT:     Head: Normocephalic.  Eyes:     Conjunctiva/sclera: Conjunctivae normal.  Cardiovascular:     Rate and Rhythm: Normal rate.  Pulmonary:     Effort: Pulmonary effort is normal. No respiratory distress.  Musculoskeletal:        General: Normal range of motion.     Cervical back: Normal range of motion.  Skin:    General: Skin is warm and dry.  Neurological:     Mental Status: She is alert and oriented to person, place, and time.  Psychiatric:        Attention and Perception: Attention and perception normal. She does not perceive auditory or visual  hallucinations.        Mood and Affect: Affect normal. Mood is anxious.        Speech: Speech normal.        Behavior: Behavior normal. Behavior is cooperative.        Thought Content: Thought content is paranoid. Thought content does not include homicidal or suicidal ideation.        Cognition and Memory: Cognition normal.        Judgment: Judgment is impulsive.    Review of Systems  Constitutional:        No other complaints voiced   All other systems reviewed and are negative.  Blood pressure (!) 139/91, pulse 67, temperature 98.2 F (36.8 C), temperature source Oral, resp. rate 18, SpO2 100 %. There is no height or weight on file to calculate BMI.  Musculoskeletal: Strength & Muscle Tone: within normal limits Gait & Station: normal Patient leans: N/A   [facility] MSE Discharge Disposition for Follow up and Recommendations: Based on my evaluation the patient does not appear to have an emergency medical condition and can be discharged with resources and follow up care in outpatient services for Individual Therapy and Follow up with resources given   Donna Kue, NP 06/23/2023, 3:02 PM
